# Patient Record
Sex: Male | Born: 1960 | Race: Black or African American | Hispanic: No | Marital: Single
Health system: Southern US, Community
[De-identification: ages and names within clinical notes are randomized; demographics above are authoritative.]

---

## 2011-09-05 ENCOUNTER — Emergency Department (HOSPITAL_COMMUNITY): Payer: Self-pay

## 2011-09-05 ENCOUNTER — Emergency Department (HOSPITAL_COMMUNITY)
Admission: EM | Admit: 2011-09-05 | Discharge: 2011-09-05 | Disposition: A | Discharge status: Home / Self Care | Payer: Self-pay | Attending: Emergency Medicine | Admitting: Emergency Medicine

## 2011-09-05 ENCOUNTER — Encounter (HOSPITAL_COMMUNITY): Payer: Self-pay | Admitting: Emergency Medicine

## 2011-09-05 DIAGNOSIS — S71109A Unspecified open wound, unspecified thigh, initial encounter: Secondary | ICD-10-CM | POA: Insufficient documentation

## 2011-09-05 DIAGNOSIS — S51809A Unspecified open wound of unspecified forearm, initial encounter: Secondary | ICD-10-CM | POA: Insufficient documentation

## 2011-09-05 DIAGNOSIS — S71009A Unspecified open wound, unspecified hip, initial encounter: Secondary | ICD-10-CM | POA: Insufficient documentation

## 2011-09-05 DIAGNOSIS — S51811A Laceration without foreign body of right forearm, initial encounter: Secondary | ICD-10-CM

## 2011-09-05 DIAGNOSIS — S71119A Laceration without foreign body, unspecified thigh, initial encounter: Secondary | ICD-10-CM

## 2011-09-05 NOTE — ED Notes (Signed)
Pt's wounds cleaned, clean dressings applied. VS WNL.

## 2011-09-05 NOTE — ED Notes (Signed)
Patient transported to X-ray 

## 2011-09-05 NOTE — ED Notes (Signed)
Pt ambulatory with balanced and steady gait and nad. Pt's breathing even and unlabored.

## 2011-09-05 NOTE — ED Notes (Signed)
MD at bedside to perform suture procedure.
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
## 2011-09-05 NOTE — ED Notes (Signed)
MD at bedside. 

## 2011-09-05 NOTE — ED Notes (Signed)
Pt returned from xray

## 2011-09-14 ENCOUNTER — Emergency Department (HOSPITAL_COMMUNITY)
Admission: EM | Admit: 2011-09-14 | Discharge: 2011-09-14 | Disposition: A | Discharge status: Home / Self Care | Payer: Self-pay | Source: Home / Self Care

## 2011-09-14 ENCOUNTER — Emergency Department (HOSPITAL_COMMUNITY)
Admission: EM | Admit: 2011-09-14 | Discharge: 2011-09-14 | Disposition: A | Discharge status: Home / Self Care | Payer: Self-pay | Attending: Emergency Medicine | Admitting: Emergency Medicine

## 2011-09-14 ENCOUNTER — Encounter (HOSPITAL_COMMUNITY): Payer: Self-pay | Admitting: *Deleted

## 2011-09-14 DIAGNOSIS — Z4802 Encounter for removal of sutures: Secondary | ICD-10-CM | POA: Insufficient documentation

## 2011-09-14 MED ORDER — TETANUS-DIPHTH-ACELL PERTUSSIS 5-2.5-18.5 LF-MCG/0.5 IM SUSP
0.5000 mL | Freq: Once | INTRAMUSCULAR | Status: AC
  Administered 2011-09-14: 0.5 mL via INTRAMUSCULAR
  Filled 2011-09-14: qty 0.5

## 2011-09-14 NOTE — ED Provider Notes (Signed)
History   This chart was scribed for Hurman Horn, MD by Melba Coon. The patient was seen in room TR09C/TR09C and the patient's care was started at 7:39PM.    CSN: [id number]  Arrival date & time 09/14/11  1825   None     Chief Complaint  Patient presents with  . Suture / Staple Removal    (Consider location/radiation/quality/duration/timing/severity/associated sxs/prior treatment) HPI Lorenzo Kleckner is a 51 y.o. male who presents to the Emergency Department for suture removal from the right forearm and left leg. Pt had them placed 10 days ago and was told to come back today for suture removal. No other complaints. No pus drainage or redness. No HA, fever, neck pain, sore throat, rash, back pain, CP, SOB, abd pain, n/v/d, dysuria, or extremity pain, edema, weakness, numbness, or tingling. No known allergies. No other pertinent medical symptoms.   History reviewed. No pertinent past medical history.  History reviewed. No pertinent past surgical history.  History reviewed. No pertinent family history.  History  Substance Use Topics  . Smoking status: Not on file  . Smokeless tobacco: Not on file  . Alcohol Use: No      Review of Systems 10 Systems reviewed and all are negative for acute change except as noted in the HPI.   Allergies  Review of patient's allergies indicates no known allergies.  Home Medications  No current outpatient prescriptions on file.  BP 172/98  Pulse 105  Temp 98.3 F (36.8 C) (Oral)  Resp 18  SpO2 95%  Physical Exam  Nursing note and vitals reviewed. Constitutional: He is oriented to person, place, and time. He appears well-developed and well-nourished. No distress.  HENT:  Head: Normocephalic and atraumatic.  Eyes: EOM are normal.  Neck: Neck supple. No tracheal deviation present.  Cardiovascular: Normal rate.   Pulmonary/Chest: Effort normal. No respiratory distress.  Musculoskeletal: Normal range of motion.  Neurological: He  is alert and oriented to person, place, and time.  Skin: Skin is warm and dry.       Right forearm and left thigh lacerations are healing well with no pain, tenderness, redness, or pus drainage.  Psychiatric: He has a normal mood and affect. His behavior is normal.    ED Course  Procedures (including critical care time)  DIAGNOSTIC STUDIES: Oxygen Saturation is 95% on room air, adequate by my interpretation.    COORDINATION OF CARE:  7:41PM - Pt will receive a tetanus shot then be d/c.  Labs Reviewed - No data to display No results found.   1. Visit for suture removal       MDM   I personally performed the services described in this documentation, which was scribed in my presence. The recorded information has been reviewed and considered.  Patient / Family / Caregiver informed of clinical course, understand medical decision-making process, and agree with plan.     Hurman Horn, MD 09/21/11 [phone]

## 2011-09-14 NOTE — ED Notes (Signed)
Reports having sutures placed to right forearm and left leg approx 10 days ago and is here to get them removed, no complaints.

## 2013-01-02 IMAGING — CR DG FEMUR 2V*L*
4 series · 4 of 4 positions shown · non-contrast
Comparison: None.

CLINICAL DATA: Laceration to anterior proximal femur

LEFT FEMUR - 2 VIEW

[t femur proximal ap left]
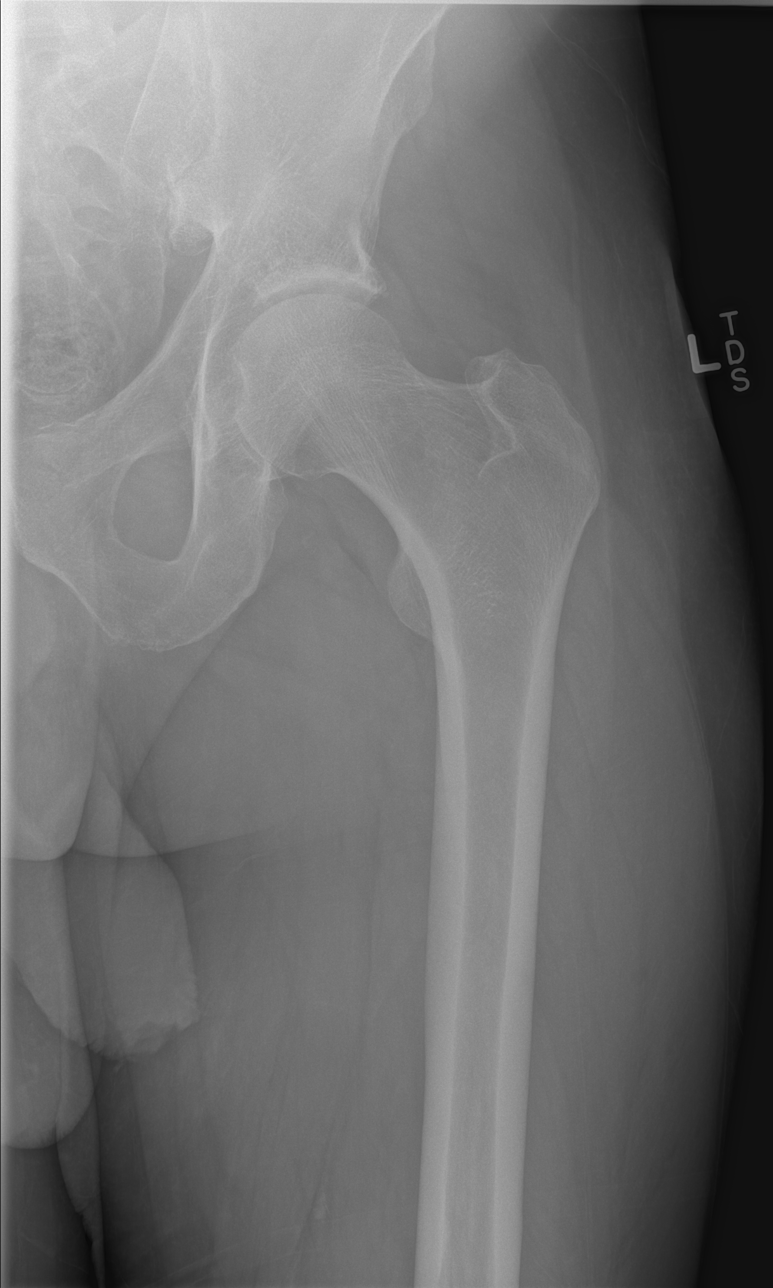

[t femur distal ap left]
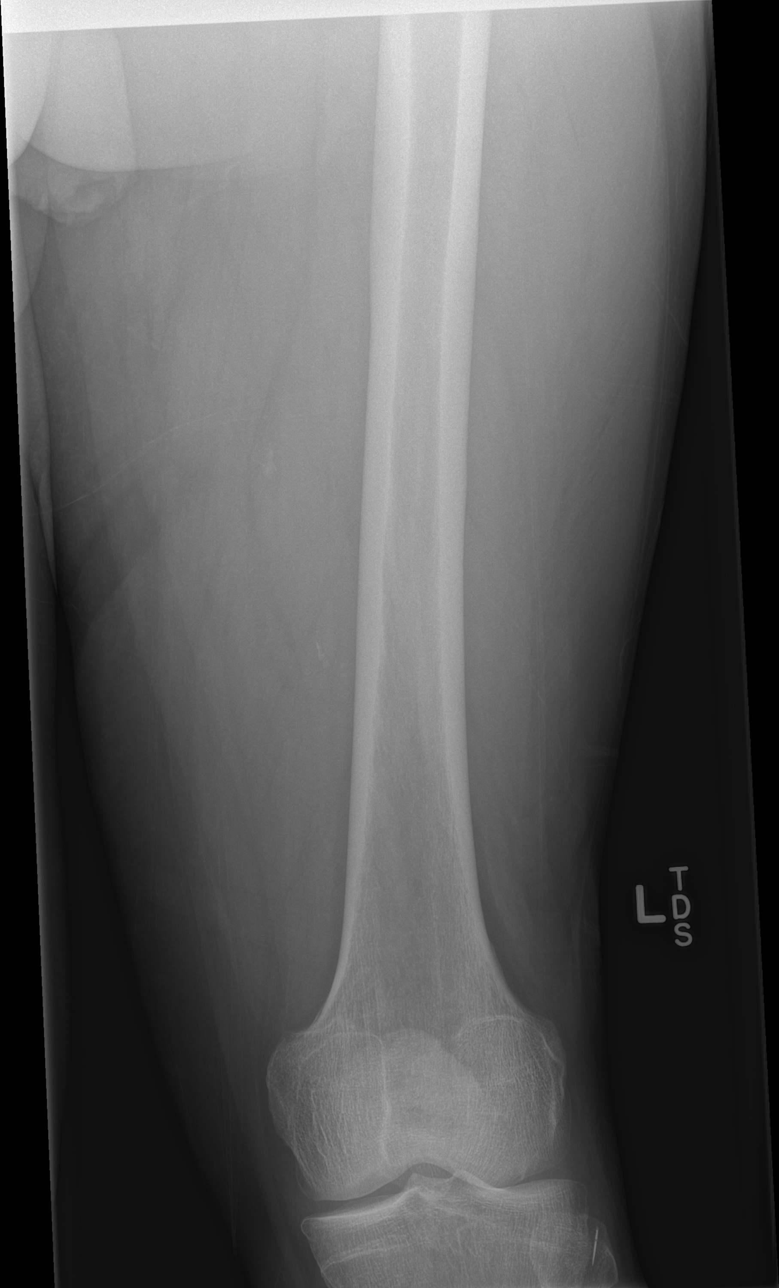

[t femur proximal lat left]
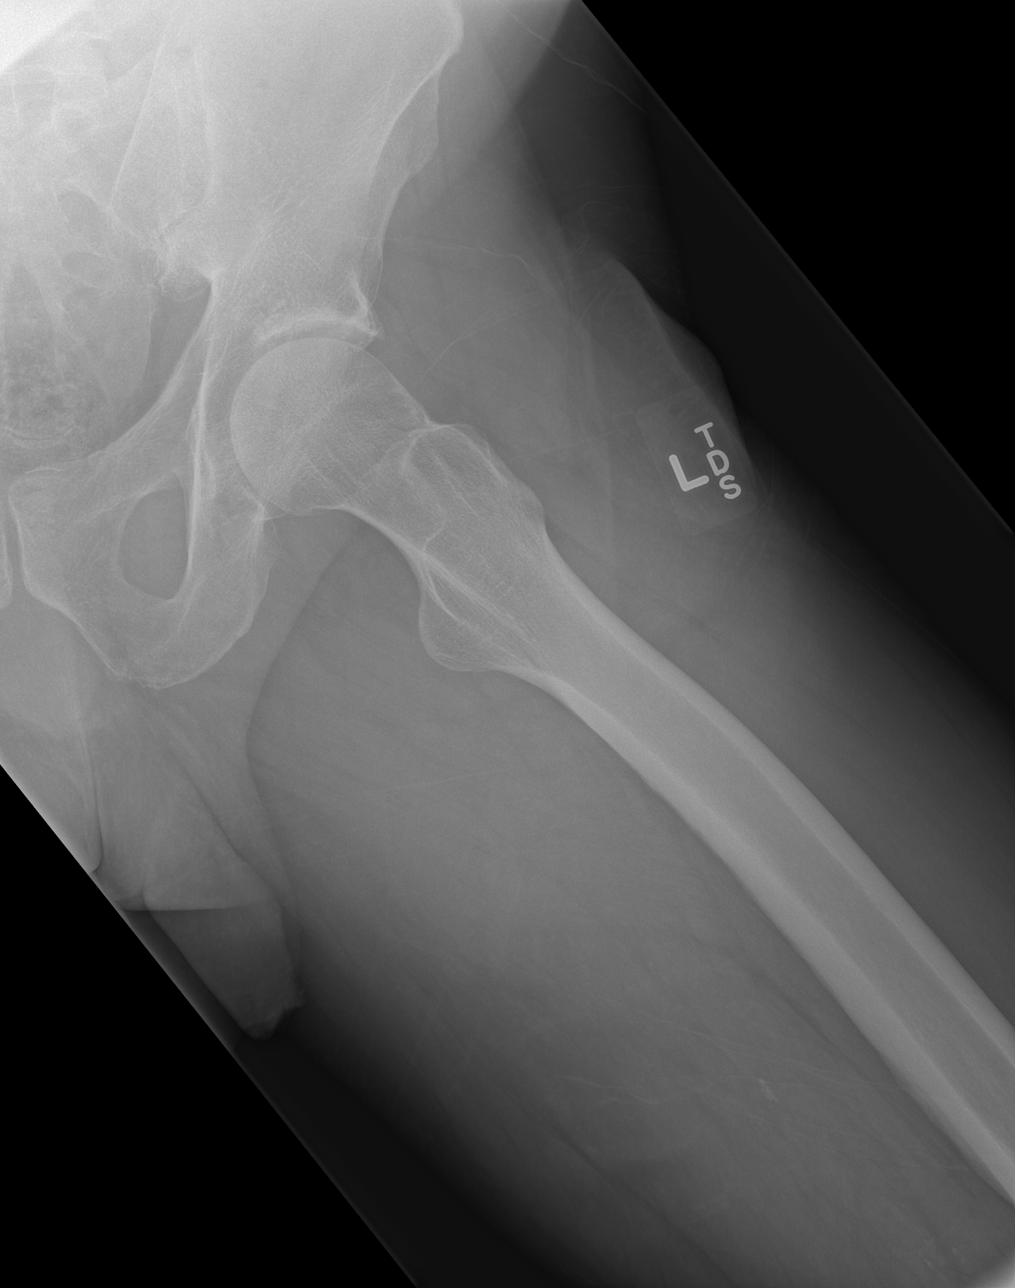

[t femur distal lat left]
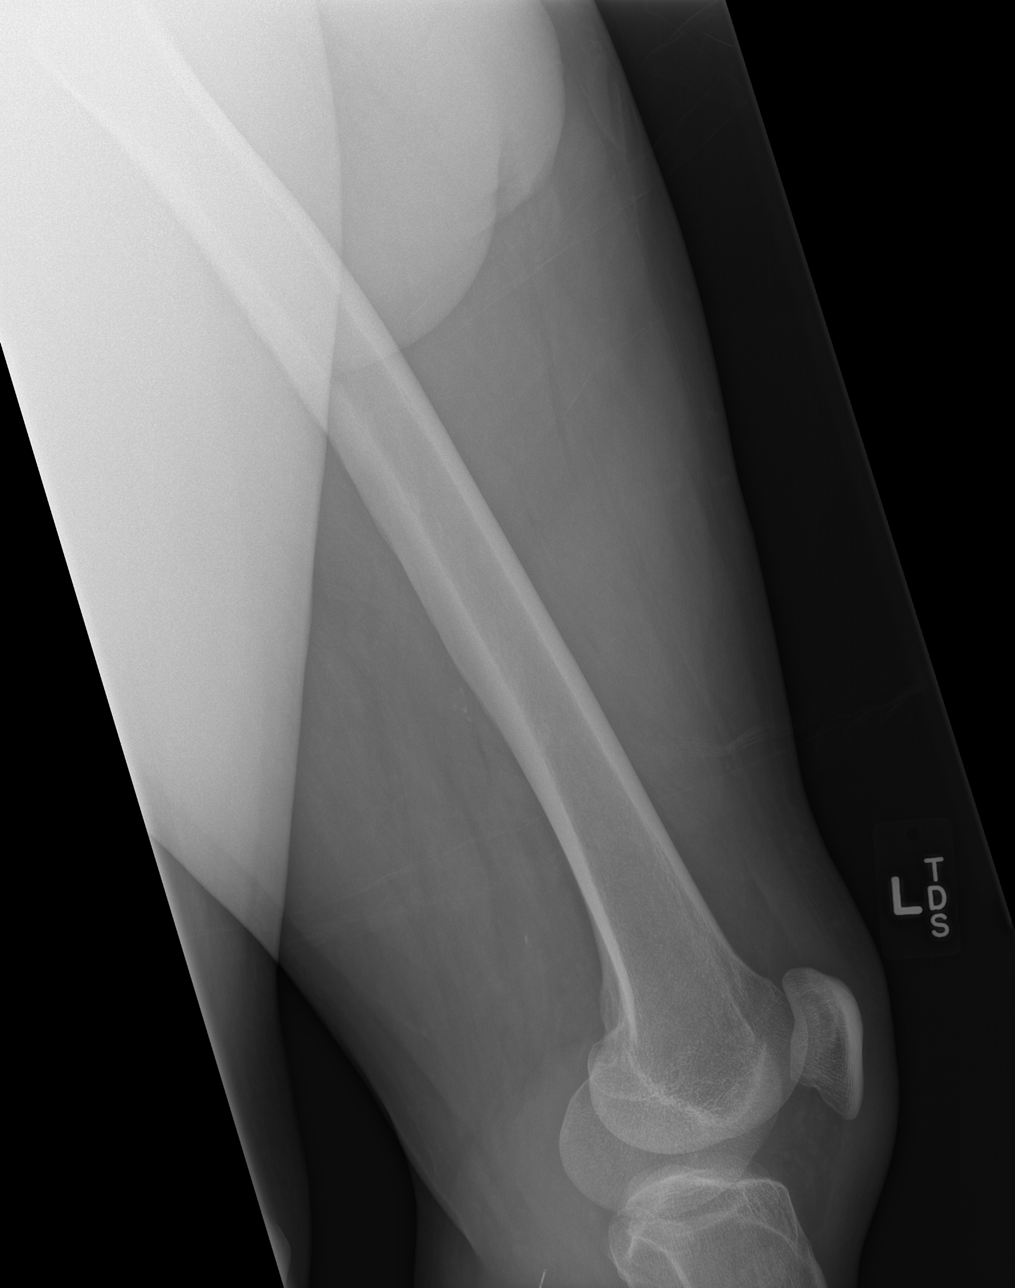

[4 of 4 positions shown; findings below may reference images not displayed]

FINDINGS: No fracture or dislocation is seen.

The joint spaces are preserved.

Although remote from the location of reported injury, a 1.3 cm
linear radiopaque foreign body is present within the soft tissues
posterior to the fibular head.
IMPRESSION: No fracture or dislocation is seen.

Suspected 1.3 cm linear radiopaque foreign body posterior to the
fibular head.

## 2018-05-28 ENCOUNTER — Emergency Department (HOSPITAL_COMMUNITY)
Admission: EM | Admit: 2018-05-28 | Discharge: 2018-06-12 | Disposition: E | Payer: Self-pay | Attending: Emergency Medicine | Admitting: Emergency Medicine

## 2018-05-28 DIAGNOSIS — I469 Cardiac arrest, cause unspecified: Secondary | ICD-10-CM | POA: Insufficient documentation

## 2018-06-12 NOTE — ED Provider Notes (Addendum)
[name]   CSN: [id number] Arrival date & time: 11-28-2018  0802    History   Chief Complaint Chief Complaint  Patient presents with  . Cardiac Arrest    HPI Jeremy Munoz is a 58 y.o. male.     HPI  Patient presents after a witnessed arrest. Patient was at work, when coworkers saw him collapse, have several moments of seizure-like activity. It is unclear how the patient received no resuscitation, but first responders performed CPR, and on EMS arrival he was noted to be in PEA. In route to the patient received multiple doses of epinephrine, multiple shocks, as he had several episodes of V. fib. Patient had asystole on the most recent several episodes of monitoring. EMS does not report evidence for trauma, nor reports from coworkers of other notable events prior to collapse. The patient himself cannot provide any details of the HPI, secondary to extremitas, level 5 caveat.  No past medical history on file.  There are no active problems to display for this patient.   No past surgical history on file.      Home Medications    Prior to Admission medications   Not on File    Family History No family history on file.  Social History Social History   Tobacco Use  . Smoking status: Not on file  Substance Use Topics  . Alcohol use: No  . Drug use: No     Allergies   Patient has no known allergies.   Review of Systems Review of Systems  Unable to perform ROS: Acuity of condition     Physical Exam Updated Vital Signs Temp (!) 94.7 F (34.8 C) (Temporal)   Ht 5\' 10"  (1.778 m)   Wt 122.5 kg   BMI 38.74 kg/m   Physical Exam Vitals signs and nursing note reviewed.  Constitutional:      General: He is in acute distress.     Appearance: He is well-developed. He is ill-appearing.     Comments: Unresponsive obese M w king airway in place  HENT:     Head: Normocephalic and atraumatic.  Eyes:   Conjunctiva/sclera: Conjunctivae normal.     Comments: Pupils mid/large w minimal reactivity  Cardiovascular:     Comments: Pulses appreciable only w CPR Pulmonary:     Comments: Ventilates w/o difficulty w assisted ventilation Abdominal:     Comments: Large, not distended  Musculoskeletal:        General: No deformity.  Skin:    General: Skin is warm and dry.  Neurological:     Comments: Flaccid, unresponsive  Psychiatric:        Cognition and Memory: Cognition is impaired.      ED Treatments / Results   Procedures  Cardiopulmonary Resuscitation (CPR) Procedure Note Directed/Performed by: Gerhard Munchobert Khamari Yousuf I personally directed ancillary staff and/or performed CPR in an effort to regain return of spontaneous circulation and to maintain cardiac, neuro and systemic perfusion.     Medications Ordered in ED Medications - No data to display   Initial Impression / Assessment and Plan / ED Course  I have reviewed the triage vital signs and the nursing notes.  Pertinent labs & imaging results that were available during my care of the patient were reviewed by me and considered in my medical decision making (see chart for details).  On arrival the patient was transferred to our monitoring equipment, continue to receive assisted ventilation, and mechanical CPR via extracorporeal  device.  Patient received approximately 45 minutes of resuscitation prior to ED arrival, with no evidence of spontaneous circulation, nor neurologic interactivity, in spite of multiple doses of appropriate medication, shock, and mechanical CPR.  After the patient continue to receive appropriate guideline guided CPR, the patient had no evidence for return to life, in spite of resuscitative efforts. Patient pronounced dead at 35.  Efforts are being made to make family members aware, and primary care physician aware.  9:00 AM Patient has no primary care physician according to his fiance. I have discussed  his case with the medical examiner.   Final Clinical Impressions(s) / ED Diagnoses  Cardiac arrest     Gerhard Munch, MD Jun 13, 2018 2035    Gerhard Munch, MD 06-13-2018 0900

## 2018-06-12 NOTE — Code Documentation (Signed)
Patient time of death occurred at 45 per dr Jeraldine Loots.

## 2018-06-12 NOTE — Progress Notes (Signed)
   05-Jun-2018 0900  Clinical Encounter Type  Visited With Health care provider  Visit Type Initial;Death;ED  Referral From Nurse  Consult/Referral To Chaplain  The chaplain responded to RN-ED call for family support. This chaplain joined MD and Chaplain-Ray as he  phoned the Pt. Fiancee-Yvette.  The fiancee is contacting the Pt. brother-Robert and making plans to arrive at the hospital.  This chaplain informed Nurse 1st of the possible visitors.  Chaplain Ray is available to join the family in the consult room when they arrive at the hospital. Spiritual care  is available for F/U as needed.
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
## 2018-06-12 NOTE — Progress Notes (Signed)
Upon family arrival I escorted patient's brother and finance' to consultation room A to be briefed by EDP. Then I escorted them to bedside to be with patient.  I stayed with family until they were ready to leave. Provided emotional and spiritual support to family and staff.    Venida Jarvis, [place], [facility], Pager [phone]

## 2018-06-12 DEATH — deceased
